# Patient Record
Sex: Male | Born: 1975 | Hispanic: Yes | Marital: Married | Smoking: Never smoker
Health system: Southern US, Community
[De-identification: ages and names within clinical notes are randomized; demographics above are authoritative.]

---

## 2018-03-08 ENCOUNTER — Other Ambulatory Visit: Payer: Self-pay

## 2018-03-08 ENCOUNTER — Emergency Department: Payer: Self-pay

## 2018-03-08 ENCOUNTER — Encounter: Payer: Self-pay | Admitting: Emergency Medicine

## 2018-03-08 ENCOUNTER — Emergency Department
Admission: EM | Admit: 2018-03-08 | Discharge: 2018-03-08 | Disposition: A | Discharge status: Home / Self Care | Payer: Self-pay | Attending: Emergency Medicine | Admitting: Emergency Medicine

## 2018-03-08 DIAGNOSIS — R079 Chest pain, unspecified: Secondary | ICD-10-CM | POA: Insufficient documentation

## 2018-03-08 LAB — CBC
HCT: 45.3 % (ref 40.0–52.0)
Hemoglobin: 15.8 g/dL (ref 13.0–18.0)
MCH: 30.6 pg (ref 26.0–34.0)
MCHC: 34.8 g/dL (ref 32.0–36.0)
MCV: 88 fL (ref 80.0–100.0)
PLATELETS: 214 10*3/uL (ref 150–440)
RBC: 5.15 MIL/uL (ref 4.40–5.90)
RDW: 13.4 % (ref 11.5–14.5)
WBC: 5.1 10*3/uL (ref 3.8–10.6)

## 2018-03-08 LAB — BASIC METABOLIC PANEL
ANION GAP: 10 (ref 5–15)
BUN: 13 mg/dL (ref 6–20)
CHLORIDE: 104 mmol/L (ref 98–111)
CO2: 26 mmol/L (ref 22–32)
CREATININE: 0.75 mg/dL (ref 0.61–1.24)
Calcium: 9.3 mg/dL (ref 8.9–10.3)
GFR calc non Af Amer: >60 mL/min (ref >60)
Glucose, Bld: 108 mg/dL — ABNORMAL HIGH (ref 70–99)
POTASSIUM: 3.6 mmol/L (ref 3.5–5.1)
SODIUM: 140 mmol/L (ref 135–145)

## 2018-03-08 LAB — TROPONIN I: Troponin I: <0.03 ng/mL (ref <0.03)

## 2018-03-08 MED ORDER — LORAZEPAM 2 MG/ML IJ SOLN
0.5000 mg | Freq: Once | INTRAMUSCULAR | Status: AC
  Administered 2018-03-08: 0.5 mg via INTRAVENOUS

## 2018-03-08 MED ORDER — HYDROXYZINE HCL 25 MG PO TABS: 25.0000 mg | ORAL_TABLET | Freq: Three times a day (TID) | ORAL | 0 refills | Status: AC | PRN

## 2018-03-08 MED ORDER — LORAZEPAM 2 MG/ML IJ SOLN
INTRAMUSCULAR | Status: AC
  Administered 2018-03-08: 0.5 mg via INTRAVENOUS
  Filled 2018-03-08: qty 1

## 2018-03-08 NOTE — ED Triage Notes (Signed)
Pt to ED via POV with c/n sudden onset of CP that started this am. Pt appears very anxious, and states SOB.  Interpreter used for triage. Pt states he feels like his throat is closing. No oral swelling or toungue swelling ntoed. VSS. PT back to MAIN room at this time

## 2018-03-08 NOTE — ED Provider Notes (Signed)
[facility] Emergency Department Provider Note  Time seen: 12:59 PM  I have reviewed the triage vital signs and the nursing notes. interpreter used for this evaluation  HISTORY  Chief Complaint Chest Pain    HPI Joe Hunter is a 42 y.o. male with no past medical history presents to the emergency department for chest discomfort/shortness of breath.  According to the patient approximately 1 hour prior to arrival to the emergency department the patient was eating lunch and began experiencing tightness throughout his chest and shortness of breath feeling like he could not get a deep breath of air.  Also felt a sensation of swelling in the throat.  Patient admits that it could have been anxiety.  Denies any known allergies to the patient.  States he is feeling much better which is mild chest tightness denies any throat symptoms currently.   History reviewed. No pertinent past medical history.  There are no active problems to display for this patient.   History reviewed. No pertinent surgical history.  Prior to Admission medications   Not on File    No Known Allergies  No family history on file.  Social History Social History   Tobacco Use  . Smoking status: Never Smoker  . Smokeless tobacco: Never Used  Substance Use Topics  . Alcohol use: Yes  . Drug use: Never    Review of Systems Constitutional: Negative for fever. ENT: sensation of swelling in his throat, now resolved Cardiovascular: Mild chest tightness, largely resolved Respiratory: Mild short of breath Gastrointestinal: Negative for abdominal pain, vomiting and diarrhea. Genitourinary: Negative for urinary compaints Musculoskeletal: Negative for musculoskeletal complaints Skin: Negative for skin complaints  Neurological: Negative for headache All other ROS negative  ____________________________________________   PHYSICAL EXAM:  VITAL SIGNS: ED Triage Vitals [03/08/18 1229]   Enc Vitals Group     BP (!) 146/82     Pulse Rate 75     Resp (!) 24     Temp 97.9 F (36.6 C)     Temp Source Oral     SpO2 100 %     Weight      Height      Head Circumference      Peak Flow      Pain Score 0     Pain Loc      Pain Edu?      Excl. in GC?    Constitutional: Alert and oriented. Well appearing, mildly anxious appearing. Eyes: Normal exam ENT   Head: Normocephalic and atraumatic.   Mouth/Throat: Mucous membranes are moist. Cardiovascular: Normal rate, regular rhythm. No murmur Respiratory: Normal respiratory effort without tachypnea nor retractions. Breath sounds are clear  Gastrointestinal: Soft and nontender. No distention.   Musculoskeletal: Nontender with normal range of motion in all extremities.  Neurologic:  Normal speech and language. No gross focal neurologic deficits  Skin:  Skin is warm, dry and intact.  Psychiatric: Mild anxious appearing.  ____________________________________________    EKG  EKG reviewed and interpreted by myself shows normal sinus rhythm at 70 bpm with a narrow QRS, normal axis, normal intervals, no ST changes.  ____________________________________________    RADIOLOGY  CXR negative  ____________________________________________   INITIAL IMPRESSION / ASSESSMENT AND PLAN / ED COURSE  Pertinent labs & imaging results that were available during my care of the patient were reviewed by me and considered in my medical decision making (see chart for details).  Patient presents to the emergency department for acute onset of  shortness of breath chest tightness and sensation of swelling in his throat.  He states most of his symptoms have resolved just mild chest tightness currently.  Patient does admit to feeling very anxious and continues to appear somewhat anxious.  States he feels much better than he did an hour ago.  We will check labs, EKG, chest x-ray we will treat with 0.5 mg of Ativan given his symptoms do appear  clinically consistent with anxiety/panic disorder.  Patient agreeable to plan of care.  Patient's work-up is largely negative.  Troponin negative.  Chest x-ray negative.  Patient feels much better.  I discussed my normal chest pain return precautions as well as discharge with a trial of hydroxyzine.  Patient agreeable to this plan of care.  ____________________________________________   FINAL CLINICAL IMPRESSION(S) / ED DIAGNOSES  Chest tightness Dyspnea    Minna AntisPaduchowski, Junie Avilla, MD 03/08/18 1452

## 2018-03-08 NOTE — ED Notes (Signed)

## 2019-08-22 IMAGING — CR DG CHEST 2V
1 series · 2 of 2 positions shown · non-contrast
Comparison: None.

CLINICAL DATA: Chest pain and tightness began today, some shortness
of breath, smoking history

EXAM:
CHEST - 2 VIEW

[Series 1: dg chest 2 view · 0.14mm/px · 2 of 2 slices shown]
[im 1/2]
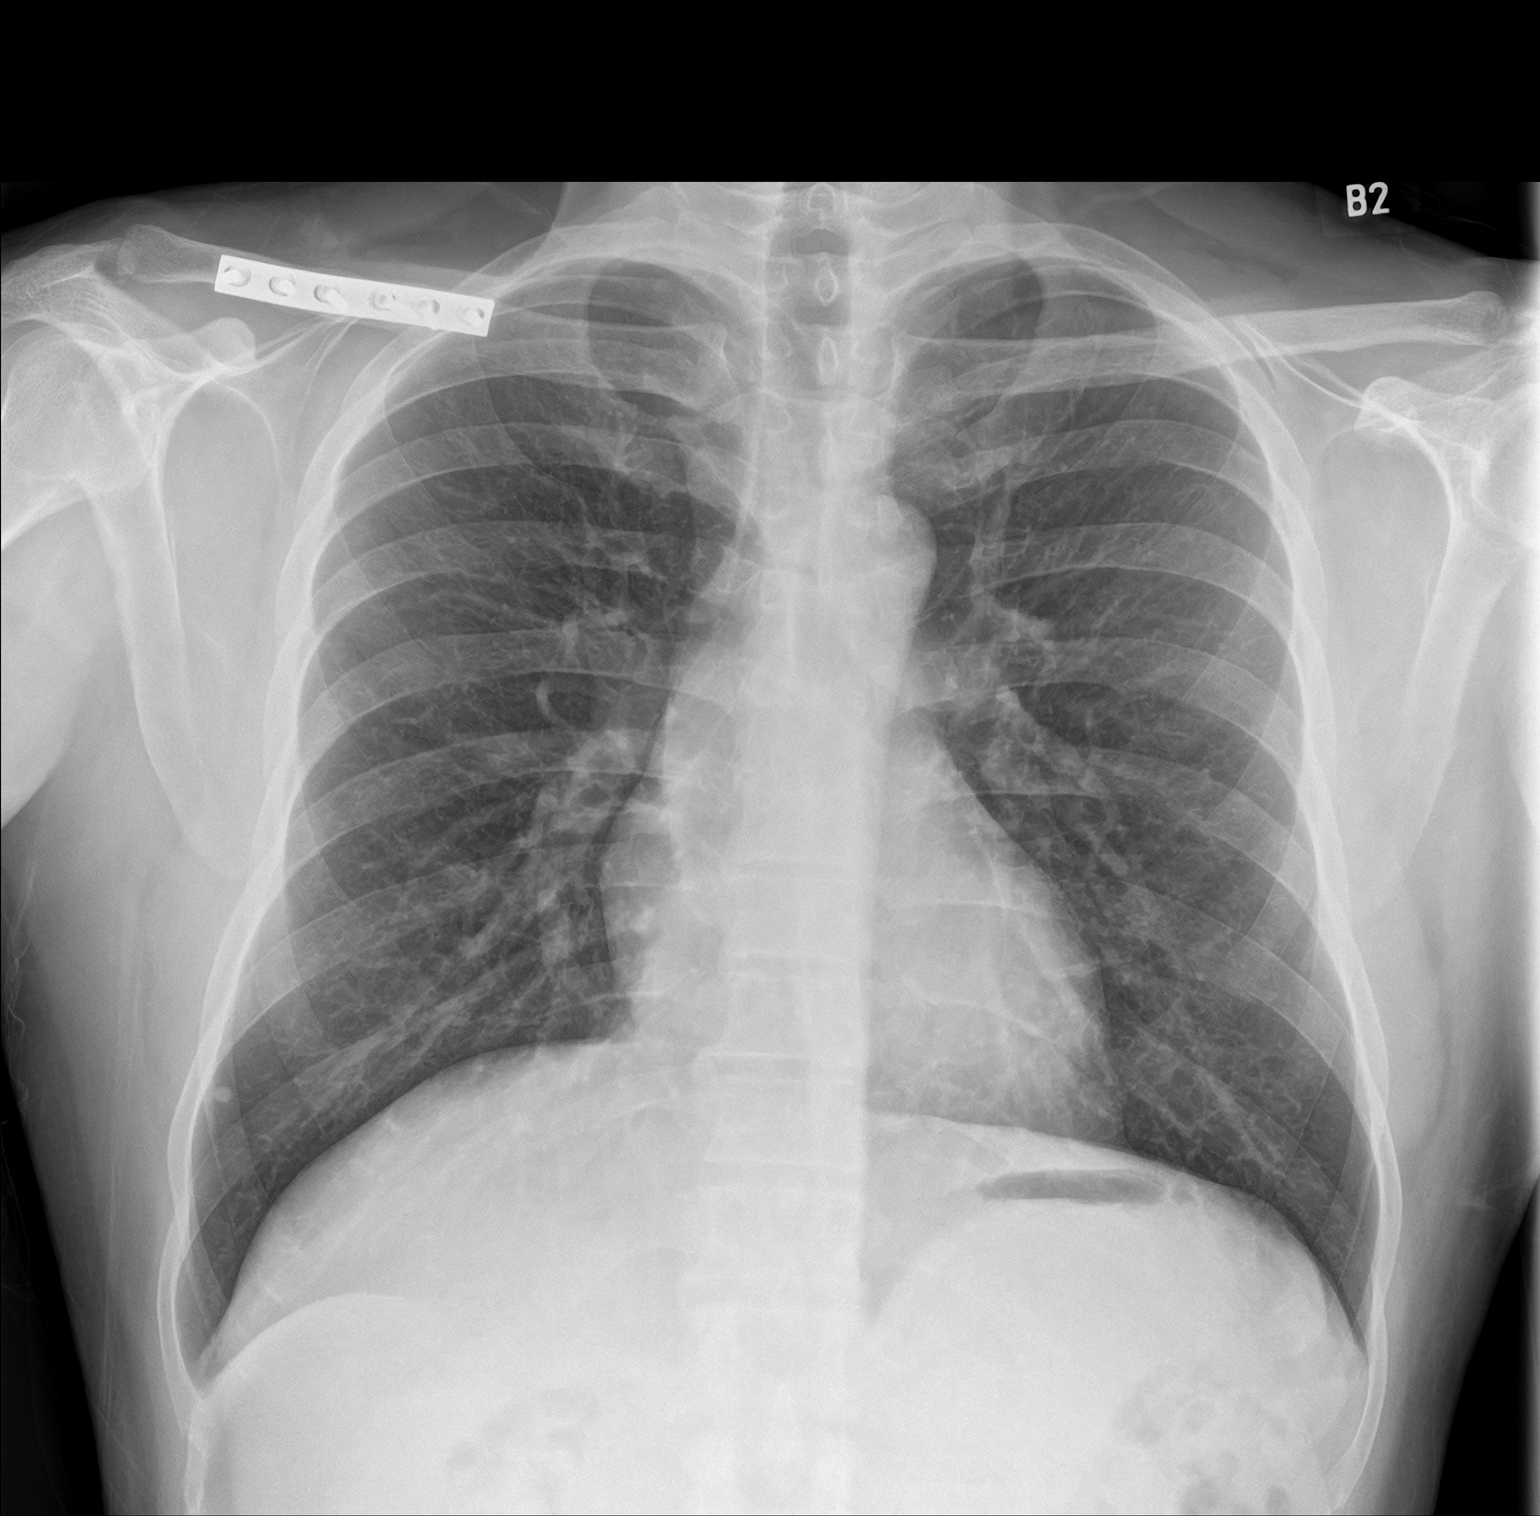
[im 2/2]
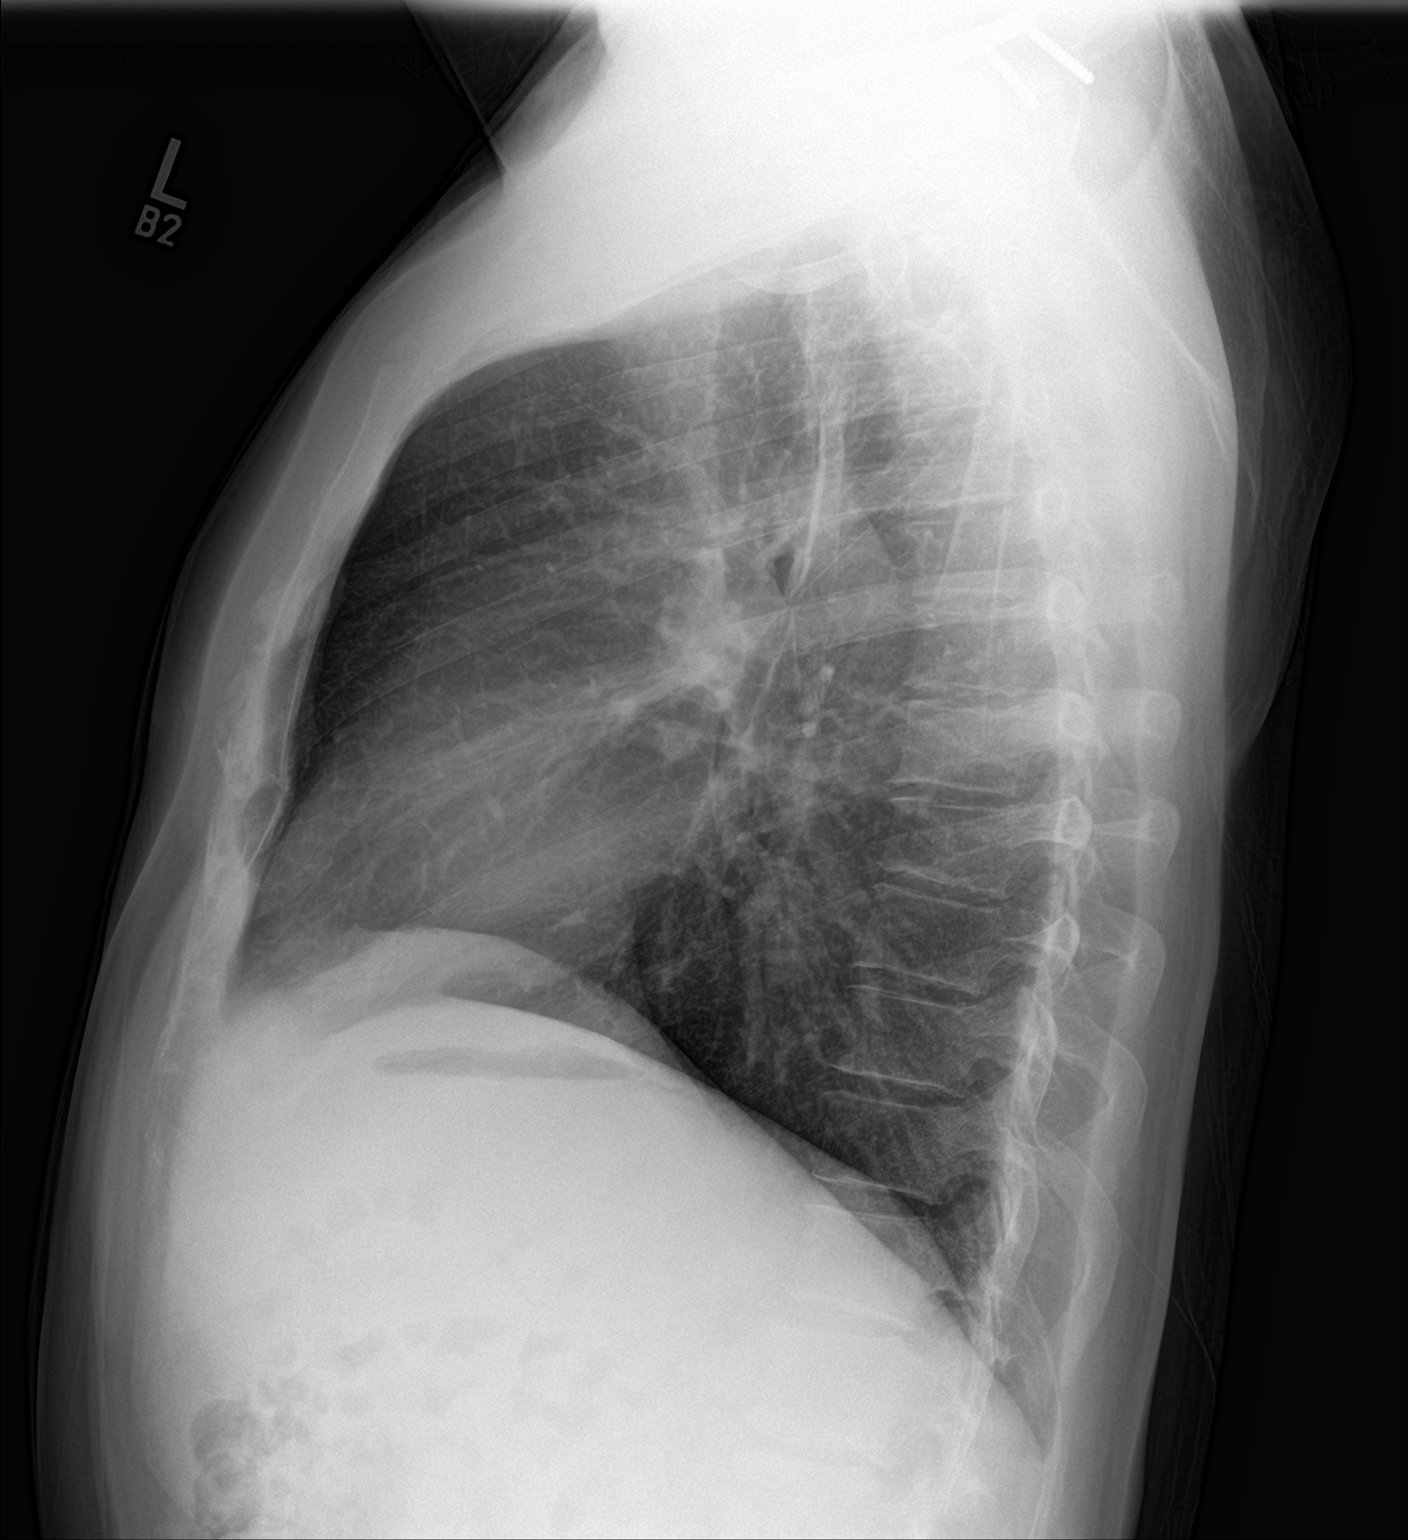

[2 of 2 positions shown; findings below may reference images not displayed]

FINDINGS: No active infiltrate or effusion is seen. Mediastinal and hilar
contours are unremarkable. A small rounded calcific opacity at the
right lung base peripherally is most consistent with a small
granuloma of doubtful clinical significance. The heart is within
normal limits in size. Fixation plate and screws are noted along the
mid right clavicle. No acute bony abnormality is seen.
IMPRESSION: No active cardiopulmonary disease. Small granuloma at the right lung
base.
